# Patient Record
Sex: Male | Born: 1960 | Race: White | Hispanic: No | Smoking: Light tobacco smoker
Health system: Southern US, Community
[De-identification: ages and names within clinical notes are randomized; demographics above are authoritative.]

## PROBLEM LIST (undated history)

## (undated) DIAGNOSIS — B182 Chronic viral hepatitis C: Secondary | ICD-10-CM

## (undated) DIAGNOSIS — E162 Hypoglycemia, unspecified: Secondary | ICD-10-CM

## (undated) HISTORY — PX: LAMINECTOMY: SHX219

---

## 2003-10-07 ENCOUNTER — Other Ambulatory Visit: Payer: Self-pay

## 2003-10-16 ENCOUNTER — Other Ambulatory Visit: Payer: Self-pay

## 2005-04-30 ENCOUNTER — Emergency Department: Payer: Self-pay | Admitting: Emergency Medicine

## 2014-08-12 ENCOUNTER — Emergency Department: Payer: Self-pay | Admitting: Emergency Medicine

## 2014-11-18 ENCOUNTER — Other Ambulatory Visit: Payer: Self-pay

## 2014-11-18 ENCOUNTER — Emergency Department: Payer: Self-pay

## 2014-11-18 ENCOUNTER — Emergency Department
Admission: EM | Admit: 2014-11-18 | Discharge: 2014-11-19 | Disposition: A | Discharge status: Home / Self Care | Payer: Self-pay | Attending: Emergency Medicine | Admitting: Emergency Medicine

## 2014-11-18 DIAGNOSIS — X58XXXA Exposure to other specified factors, initial encounter: Secondary | ICD-10-CM | POA: Insufficient documentation

## 2014-11-18 DIAGNOSIS — Y9389 Activity, other specified: Secondary | ICD-10-CM | POA: Insufficient documentation

## 2014-11-18 DIAGNOSIS — Y998 Other external cause status: Secondary | ICD-10-CM | POA: Insufficient documentation

## 2014-11-18 DIAGNOSIS — S30860A Insect bite (nonvenomous) of lower back and pelvis, initial encounter: Secondary | ICD-10-CM | POA: Insufficient documentation

## 2014-11-18 DIAGNOSIS — R51 Headache: Secondary | ICD-10-CM | POA: Insufficient documentation

## 2014-11-18 DIAGNOSIS — S20369A Insect bite (nonvenomous) of unspecified front wall of thorax, initial encounter: Secondary | ICD-10-CM | POA: Insufficient documentation

## 2014-11-18 DIAGNOSIS — S20162A Insect bite (nonvenomous) of breast, left breast, initial encounter: Secondary | ICD-10-CM | POA: Insufficient documentation

## 2014-11-18 DIAGNOSIS — R739 Hyperglycemia, unspecified: Secondary | ICD-10-CM | POA: Insufficient documentation

## 2014-11-18 DIAGNOSIS — R519 Headache, unspecified: Secondary | ICD-10-CM

## 2014-11-18 DIAGNOSIS — R079 Chest pain, unspecified: Secondary | ICD-10-CM | POA: Insufficient documentation

## 2014-11-18 DIAGNOSIS — W57XXXA Bitten or stung by nonvenomous insect and other nonvenomous arthropods, initial encounter: Secondary | ICD-10-CM

## 2014-11-18 DIAGNOSIS — Z720 Tobacco use: Secondary | ICD-10-CM | POA: Insufficient documentation

## 2014-11-18 DIAGNOSIS — Y9289 Other specified places as the place of occurrence of the external cause: Secondary | ICD-10-CM | POA: Insufficient documentation

## 2014-11-18 HISTORY — DX: Hypoglycemia, unspecified: E16.2

## 2014-11-18 HISTORY — DX: Chronic viral hepatitis C: B18.2

## 2014-11-18 LAB — CBC
HEMATOCRIT: 42.1 % (ref 40.0–52.0)
Hemoglobin: 14.5 g/dL (ref 13.0–18.0)
MCH: 34 pg (ref 26.0–34.0)
MCHC: 34.5 g/dL (ref 32.0–36.0)
MCV: 98.4 fL (ref 80.0–100.0)
Platelets: 148 10*3/uL — ABNORMAL LOW (ref 150–440)
RBC: 4.27 MIL/uL — ABNORMAL LOW (ref 4.40–5.90)
RDW: 14.1 % (ref 11.5–14.5)
WBC: 6.3 10*3/uL (ref 3.8–10.6)

## 2014-11-18 LAB — URINALYSIS COMPLETE WITH MICROSCOPIC (ARMC ONLY)
BILIRUBIN URINE: NEGATIVE
Bacteria, UA: NONE SEEN
Glucose, UA: >500 mg/dL — AB
LEUKOCYTES UA: NEGATIVE
Nitrite: NEGATIVE
Protein, ur: NEGATIVE mg/dL
Specific Gravity, Urine: 1.025 (ref 1.005–1.030)
pH: 5 (ref 5.0–8.0)

## 2014-11-18 LAB — COMPREHENSIVE METABOLIC PANEL
ALBUMIN: 4.4 g/dL (ref 3.5–5.0)
ALT: 109 U/L — ABNORMAL HIGH (ref 17–63)
ANION GAP: 19 — AB (ref 5–15)
AST: 132 U/L — AB (ref 15–41)
Alkaline Phosphatase: 50 U/L (ref 38–126)
BILIRUBIN TOTAL: 1.1 mg/dL (ref 0.3–1.2)
BUN: 15 mg/dL (ref 6–20)
CALCIUM: 8.4 mg/dL — AB (ref 8.9–10.3)
CO2: 18 mmol/L — ABNORMAL LOW (ref 22–32)
CREATININE: 1.21 mg/dL (ref 0.61–1.24)
Chloride: 97 mmol/L — ABNORMAL LOW (ref 101–111)
GFR calc Af Amer: >60 mL/min (ref >60)
GFR calc non Af Amer: >60 mL/min (ref >60)
Glucose, Bld: 303 mg/dL — ABNORMAL HIGH (ref 65–99)
Potassium: 3.6 mmol/L (ref 3.5–5.1)
Sodium: 134 mmol/L — ABNORMAL LOW (ref 135–145)
TOTAL PROTEIN: 7.8 g/dL (ref 6.5–8.1)

## 2014-11-18 LAB — TROPONIN I: Troponin I: <0.03 ng/mL (ref <0.031)

## 2014-11-18 MED ORDER — DOXYCYCLINE HYCLATE 100 MG PO TABS
100.0000 mg | ORAL_TABLET | Freq: Once | ORAL | Status: AC
  Administered 2014-11-18: 100 mg via ORAL

## 2014-11-18 MED ORDER — THIAMINE HCL 100 MG/ML IJ SOLN
Freq: Once | INTRAVENOUS | Status: AC
  Administered 2014-11-18: 22:00:00 via INTRAVENOUS
  Filled 2014-11-18: qty 1000

## 2014-11-18 MED ORDER — DOXYCYCLINE HYCLATE 100 MG PO TABS
ORAL_TABLET | ORAL | Status: AC
  Administered 2014-11-18: 100 mg via ORAL
  Filled 2014-11-18: qty 1

## 2014-11-18 MED ORDER — ONDANSETRON HCL 4 MG/2ML IJ SOLN
INTRAMUSCULAR | Status: AC
  Administered 2014-11-18: 4 mg via INTRAVENOUS
  Filled 2014-11-18: qty 2

## 2014-11-18 MED ORDER — DOXYCYCLINE HYCLATE 50 MG PO CAPS: 100.0000 mg | ORAL_CAPSULE | Freq: Two times a day (BID) | ORAL | Status: AC

## 2014-11-18 MED ORDER — ONDANSETRON HCL 4 MG/2ML IJ SOLN
4.0000 mg | Freq: Once | INTRAMUSCULAR | Status: AC
  Administered 2014-11-18: 4 mg via INTRAVENOUS

## 2014-11-18 NOTE — ED Notes (Signed)
Patient returned from x-ray. Warm blankets given. Specimens sent to lab. Awaiting MD and results.

## 2014-11-18 NOTE — ED Provider Notes (Signed)
[facility] Emergency Department Provider Note  ____________________________________________  Time seen: 2020  I have reviewed the triage vital signs and the nursing notes.   HISTORY  Chief Complaint Chest Pain and Headache     HPI Robert Sawyer is a 54 y.o. male who presents complaining of general weakness and fatigue, feeling unsteady, concerns for his glucose level, chest pain for 1 day , and of a headache for the past 2-3 days.   The patient [place]. He has no access to healthcare. He has many many tick bites on him. This includes 5 live ticks that I just removed off of him. He has numerous red spots that are likely prior tick bites. These appear to be [employer].   He denies any fever.  Today, while feeling weak and unstable, he was concerned that his sugar level may have dropped. He does not have a diagnosis of diabetes but he has had hypoglycemia once previously. He tells me it was severe when it occurred. He did not take any diabetes medication leading to that incident.  Today, with suspicion that his sugar was low, he went to a grocery store. He was able to obtain a bottle of [facility], a bottle of Gatorade, and a can of monster energy drink. He drank all 3. He continues to feel a little uncomfortable and fatigued. With that ongoing weakness, 911 was called and he was brought to the emergency department.      Past Medical History  Diagnosis Date  . Hypoglycemia   . Hep C w/o coma, chronic     There are no active problems to display for this patient.   Past Surgical History  Procedure Laterality Date  . Laminectomy      Current Outpatient Rx  Name  Route  Sig  Dispense  Refill  . doxycycline (VIBRAMYCIN) 50 MG capsule   Oral   Take 2 capsules (100 mg total) by mouth 2 (two) times daily.   28 capsule   0     Allergies Review of patient's allergies indicates no known allergies.  No family history on  file.  Social History History  Substance Use Topics  . Smoking status: Light Tobacco Smoker  . Smokeless tobacco: Not on file  . Alcohol Use: Yes    Review of Systems  Constitutional: Negative for fever. Positive for general weakness. ENT: Negative for sore throat. Cardiovascular: Chest pain today.Marland Kitchen Respiratory: Negative for shortness of breath. Gastrointestinal: Negative for abdominal pain, vomiting and diarrhea. Genitourinary: Negative for dysuria. Musculoskeletal: Negative for back pain. Skin: Numerous tick bites.. Neurological: Headache for 2-3 days.   10-point ROS otherwise negative.  ____________________________________________   PHYSICAL EXAM:  VITAL SIGNS: ED Triage Vitals  Enc Vitals Group     BP 11/18/14 1913 148/118 mmHg     Pulse Rate 11/18/14 1913 89     Resp 11/18/14 1913 18     Temp 11/18/14 1913 98.5 F (36.9 C)     Temp Source 11/18/14 1913 Oral     SpO2 11/18/14 1913 94 %     Weight 11/18/14 1913 189 lb (85.73 kg)     Height 11/18/14 1913  (1.753 m)     Head Cir --      Peak Flow --      Pain Score 11/18/14 1920 5     Pain Loc --      Pain Edu? --      Excl.  in GC? --     Constitutional:  Alert and oriented. Well-nourished and well-developed but appears slightly uncomfortable. ENT   Head: Normocephalic and atraumatic.   Nose: No congestion/rhinnorhea.   Mouth/Throat: Mucous membranes are moist. Cardiovascular: Normal rate, regular rhythm. Respiratory: Normal respiratory effort without tachypnea. Breath sounds are clear and equal bilaterally. No wheezes/rales/rhonchi. Gastrointestinal: Soft and nontender. No distention.  Back: No muscle spasm, no tenderness, no CVA tenderness. Musculoskeletal: Nontender with normal range of motion in all extremities.  No noted edema. Neurologic:  Normal speech and language. No gross focal neurologic deficits are appreciated.  Equal grip strength. Negative pronator drift though he does have a  mild tremor noted in the right arm. Finger to nose is satisfactory. Patient is ambulatory. 5 over 5 strength in all 4 extremities. Cognition intact. Skin: I have removed 5 ticks from this patient's body. This included 2 on his chest and 3 on his back. In examination of his scalp found no ticks there. The patient pulled his pants down and we found no ticks around his buttock or his legs.  Psychiatric: Mood and affect are normal. Speech and behavior are normal.  ____________________________________________    LABS (pertinent positives/negatives)  Glucose elevated at 303, Labs Reviewed  CBC - Abnormal; Notable for the following:    RBC 4.27 (*)    Platelets 148 (*)    All other components within normal limits  COMPREHENSIVE METABOLIC PANEL - Abnormal; Notable for the following:    Sodium 134 (*)    Chloride 97 (*)    CO2 18 (*)    Glucose, Bld 303 (*)    Calcium 8.4 (*)    AST 132 (*)    ALT 109 (*)    Anion gap 19 (*)    All other components within normal limits  URINALYSIS COMPLETEWITH MICROSCOPIC ([facility] ONLY) - Abnormal; Notable for the following:    Color, Urine YELLOW (*)    APPearance CLEAR (*)    Glucose, UA >500 (*)    Ketones, ur 1+ (*)    Hgb urine dipstick 1+ (*)    Squamous Epithelial / LPF 0-5 (*)    All other components within normal limits  TROPONIN I    UA: Normal ____________________________________________   EKG  ED ECG REPORT I, Zion Lint W, the attending physician, personally viewed and interpreted this ECG.   Date: 11/18/2014  EKG Time: 1915  Rate: 93  Rhythm: Normal sinus rhythm  Axis: Normal  Intervals: Normal  ST&T Change: None   ____________________________________________    RADIOLOGY  Chest x-ray  IMPRESSION: No active cardiopulmonary disease. ____________________________________________   PROCEDURES  Tick removal: Removed 5 ticks from the patient's torso.  ____________________________________________   INITIAL  IMPRESSION / ASSESSMENT AND PLAN / ED COURSE  The patient thought his blood sugar may have been low, but he has no history of diabetes and does not take medicines for high sugars. It is unclear if he was feeling weak for other reasons, but at this point, after drinking [employer], Gatorade, and monster energy drink, he is hyperglycemic. We will treat him with additional IV fluids. Due to his general poor condition and homelessness and questionable alcohol use, I will treat him with a "banana bag".  We have removed numerous ticks. He is at high-risk for tick borne illness. We will treat him with doxycycline. Not sure how he will purchase the prescription. We will work on getting him social work support.  ----------------------------------------- 9:59 PM on 11/18/2014 -----------------------------------------  I discussed the patient's case with social work. They're kind enough to provide a matching certificate that the patient can take to [facility] to get his doxycycline prescription filled with a small copay of $3.  I will last my colleague, Dr. Manson Passey, to recheck the patient prior to discharge. Meanwhile we will continue to give him an infusion of a "banana bag", allow him to eat, and recheck his sugar prior to departure. Patient will need follow-up care. [facility] who are [facility]. We will also refer him to [facility] clinic since they are on call for medical backup, and of course she is welcome to return to the emergency department.   ____________________________________________   FINAL CLINICAL IMPRESSION(S) / ED DIAGNOSES  Final diagnoses:  Hyperglycemia  Tick bites  Acute nonintractable headache, unspecified headache type       Darien Ramus, MD 11/18/14 2205

## 2014-11-18 NOTE — Discharge Instructions (Signed)
He had an elevated blood sugar level after drinking [address], Gatorade, and a monster energy drink. Do not drink so much simple sugar drinks and avoid energy drinks in general. You had numerous tick bites. We removed 5 ticks here in the emergency department. You may have a tick borne illness. Take doxycycline for the next 2 weeks. A certificate to help you buy this medication at a [facility] is being provided to you. He will have to pay a $3 co-pay. He may return to the emergency department if you have any urgent concerns, but we also think he needs to follow-up with regular physician. [facility] or he can [facility]up with [facility].  Tick Bite Information Ticks are insects that attach themselves to the skin. There are many types of ticks. Common types include wood ticks and deer ticks. Sometimes, ticks carry diseases that can make a person very ill. The most common places for ticks to attach themselves are the scalp, neck, armpits, waist, and groin.  HOW CAN YOU PREVENT TICK BITES? Take these steps to help prevent tick bites when you are outdoors:  Wear long sleeves and long pants.  Wear white clothes so you can see ticks more easily.  Tuck your pant legs into your socks.  If walking on a trail, stay in the middle of the trail to avoid brushing against bushes.  Avoid walking through areas with long grass.  Put bug spray on all skin that is showing and along boot tops, pant legs, and sleeve cuffs.  Check clothes, hair, and skin often and before going inside.  Brush off any ticks that are not attached.  Take a shower or bath as soon as possible after being outdoors. HOW SHOULD YOU REMOVE A TICK? Ticks should be removed as soon as possible to help prevent diseases.  If latex gloves are available, put them on before trying to remove a tick.  Use tweezers to grasp the tick as close to the skin as possible. You may also use curved forceps or a tick removal tool. Grasp  the tick as close to its head as possible. Avoid grasping the tick on its body.  Pull gently upward until the tick lets go. Do not twist the tick or jerk it suddenly. This may break off the tick's head or mouth parts.  Do not squeeze or crush the tick's body. This could force disease-carrying fluids from the tick into your body.  After the tick is removed, wash the bite area and your hands with soap and water or alcohol.  Apply a small amount of antiseptic cream or ointment to the bite site.  Wash any tools that were used. Do not try to remove a tick by applying a hot match, petroleum jelly, or fingernail polish to the tick. These methods do not work. They may also increase the chances of disease being spread from the tick bite. WHEN SHOULD YOU SEEK HELP? Contact your health care provider if you are unable to remove a tick or if a part of the tick breaks off in the skin. After a tick bite, you need to watch for signs and symptoms of diseases that can be spread by ticks. Contact your health care provider if you develop any of the following:  Fever.  Rash.  Redness and puffiness (swelling) in the area of the tick bite.  Tender, puffy lymph glands.  Watery poop (diarrhea).  Weight loss.  Cough.  Feeling more tired than normal (fatigue).  Muscle, joint, or bone pain.  Belly (abdominal) pain.  Headache.  Change in your level of consciousness.  Trouble walking or moving your legs.  Loss of feeling (numbness) in the legs.  Loss of movement (paralysis).  Shortness of breath.  Confusion.  Throwing up (vomiting) many times. Document Released: 08/15/2009 Document Revised: 01/21/2013 Document Reviewed: 10/29/2012 [facility] Patient Information 2015 [place], [place]. This information is not intended to replace advice given to you by your health care provider. Make sure you discuss any questions you have with your health care provider.  High Blood Sugar High blood sugar  (hyperglycemia) means that the level of sugar in your blood is higher than it should be. Signs of high blood sugar include:  Feeling thirsty.  Frequent peeing (urinating).  Feeling tired or sleepy.  Dry mouth.  Vision changes.  Feeling weak.  Feeling hungry but losing weight.  Numbness and tingling in your hands or feet.  Headache. When you ignore these signs, your blood sugar may keep going up. These problems may get worse, and other problems may begin. HOME CARE  Check your blood sugars as told by your doctor. Write down the numbers with the date and time.  Take the right amount of insulin or diabetes pills at the right time. Write down the dose with date and time.  Refill your insulin or diabetes pills before running out.  Watch what you eat. Follow your meal plan.  Drink liquids without sugar, such as water. Check with your doctor if you have kidney or heart disease.  Follow your doctor's orders for exercise. Exercise at the same time of day.  Keep your doctor's appointments. GET HELP RIGHT AWAY IF:   You have trouble thinking or are confused.  You have fast breathing with fruity smelling breath.  You pass out (faint).  You have 2 to 3 days of high blood sugars and you do not know why.  You have chest pain.  You are feeling sick to your stomach (nauseous) or throwing up (vomiting).  You have sudden vision changes. MAKE SURE YOU:   Understand these instructions.  Will watch your condition.  Will get help right away if you are not doing well or get worse. Document Released: 03/18/2009 Document Revised: 08/13/2011 Document Reviewed: 03/18/2009 [facility] Patient Information 2015 [place], [place]. This information is not intended to replace advice given to you by your health care provider. Make sure you discuss any questions you have with your health care provider.

## 2014-11-18 NOTE — ED Notes (Signed)
Patient began to feel bad about 2 days ago. Chills and sweats. Cough. Sore throat.

## 2014-11-18 NOTE — ED Notes (Signed)
Patient complained of some nausea after eating. MD made aware. Patient medicated for same.

## 2014-11-18 NOTE — Care Management (Signed)
ED CM received call from Dr. Carollee Massed at [facility] ED regarding patient needing medication assistance. Patient is uninsured and being discharged with antibiotics, patient cannot afford the cost of the Medication. Patient Enrolled in [facility],  $3 co-pay per prescription. MATCH Letter faxed to Dr. Carollee Massed [phone],[facility] confirmation received.

## 2014-11-19 ENCOUNTER — Telehealth: Payer: Self-pay | Admitting: Surgery

## 2014-11-19 LAB — GLUCOSE, CAPILLARY: Glucose-Capillary: 122 mg/dL — ABNORMAL HIGH (ref 65–99)

## 2014-11-19 NOTE — ED Provider Notes (Signed)
Assumed care of the patient at 11:00 PM Patient ate no complaints at present will be discharged as per Dr. Leata Mouse recommendations.  Darci Current, MD 11/19/14 [phone]

## 2014-11-19 NOTE — Telephone Encounter (Signed)
Received call from [facility] at [facility] in [place] concerning the [facility] letter that patient presented. Prescription was written by Dr. Carollee Massed who is not in [facility] system. Contacted [employer] Pharmacist over the [facility] at [facility], regarding will be able to add on Monday 6/20. [facility] has agreed to fill prescription and process MATCH then. No further CM needs needs identified.

## 2016-12-16 IMAGING — CR DG CHEST 2V
2 series · 2 of 2 positions shown · non-contrast
Comparison: 08/12/2014

CLINICAL DATA: Chills and sweats, cough, sore throat

EXAM:
CHEST  2 VIEW

[chest pa]
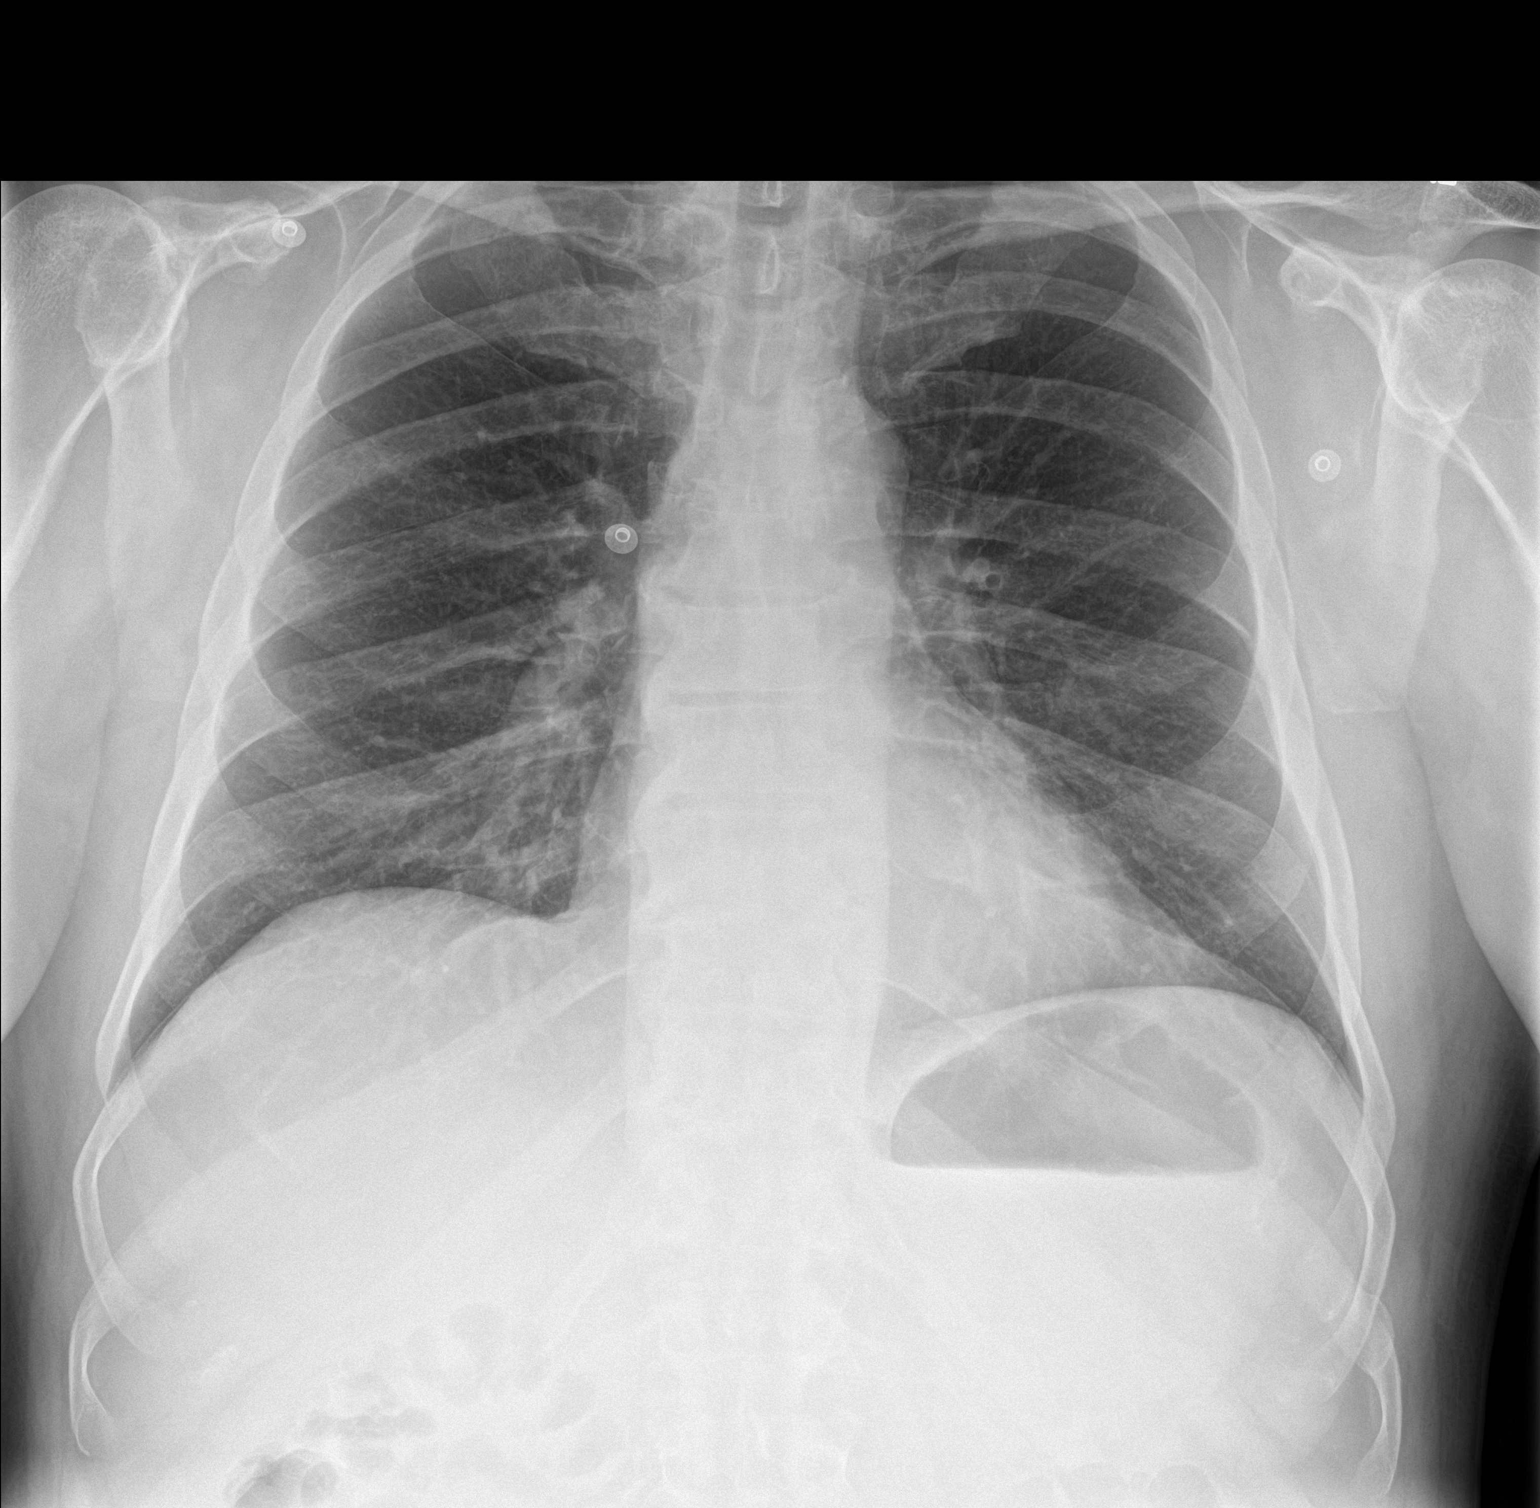

[chest lat]
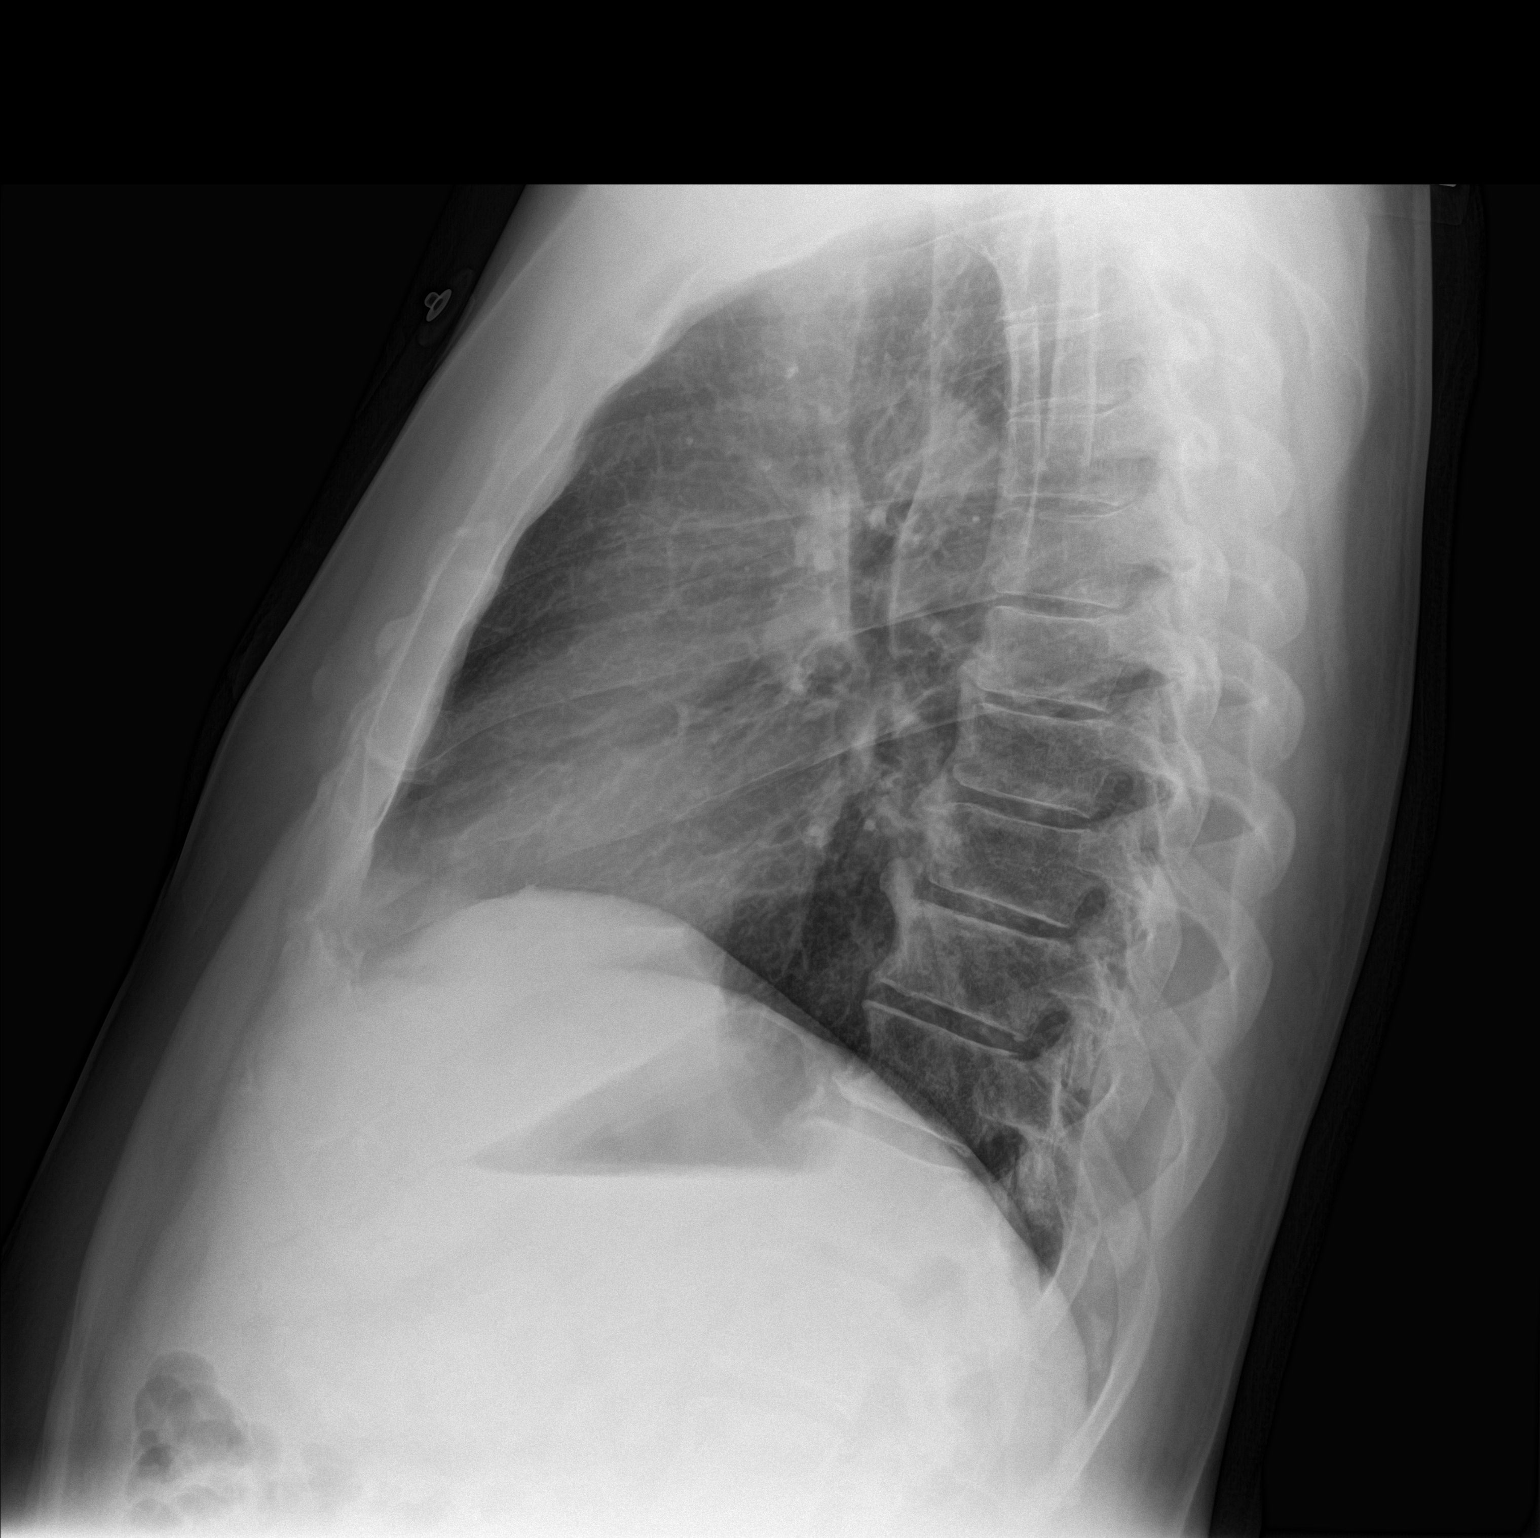

[2 of 2 positions shown; findings below may reference images not displayed]

FINDINGS: There is no focal parenchymal opacity. There is no pleural effusion
or pneumothorax. The heart and mediastinal contours are
unremarkable.

There is no acute osseous abnormality. There is evidence of prior
lower anterior cervical fusion.
IMPRESSION: No active cardiopulmonary disease.
